# Patient Record
Sex: Male | Born: 1980 | Race: Black or African American | Hispanic: No | Marital: Married
Health system: Midwestern US, Community
[De-identification: ages and names within clinical notes are randomized; demographics above are authoritative.]

## PROBLEM LIST (undated history)

## (undated) DIAGNOSIS — N289 Disorder of kidney and ureter, unspecified: Secondary | ICD-10-CM

## (undated) DIAGNOSIS — K219 Gastro-esophageal reflux disease without esophagitis: Secondary | ICD-10-CM

## (undated) DIAGNOSIS — J45909 Unspecified asthma, uncomplicated: Secondary | ICD-10-CM

## (undated) DIAGNOSIS — G4730 Sleep apnea, unspecified: Secondary | ICD-10-CM

## (undated) HISTORY — PX: WISDOM TOOTH EXTRACTION: SHX21

## (undated) HISTORY — PX: FRACTURE SURGERY: SHX138

---

## 2016-12-31 ENCOUNTER — Emergency Department (HOSPITAL_COMMUNITY): Payer: Self-pay

## 2016-12-31 ENCOUNTER — Inpatient Hospital Stay (HOSPITAL_COMMUNITY): Payer: Self-pay | Admitting: Certified Registered"

## 2016-12-31 ENCOUNTER — Inpatient Hospital Stay (HOSPITAL_COMMUNITY): Payer: Self-pay

## 2016-12-31 ENCOUNTER — Encounter (HOSPITAL_COMMUNITY): Payer: Self-pay | Admitting: Emergency Medicine

## 2016-12-31 ENCOUNTER — Encounter (HOSPITAL_COMMUNITY): Admission: EM | Disposition: A | Discharge status: Home / Self Care | Payer: Self-pay | Source: Home / Self Care

## 2016-12-31 ENCOUNTER — Inpatient Hospital Stay (HOSPITAL_COMMUNITY)
Admission: EM | Admit: 2016-12-31 | Discharge: 2017-01-04 | DRG: 493 | Disposition: A | Discharge status: Home / Self Care | Payer: PRIVATE HEALTH INSURANCE | Attending: Orthopedic Surgery | Admitting: Orthopedic Surgery

## 2016-12-31 DIAGNOSIS — R778 Other specified abnormalities of plasma proteins: Secondary | ICD-10-CM

## 2016-12-31 DIAGNOSIS — Z886 Allergy status to analgesic agent status: Secondary | ICD-10-CM

## 2016-12-31 DIAGNOSIS — S81012A Laceration without foreign body, left knee, initial encounter: Secondary | ICD-10-CM | POA: Diagnosis present

## 2016-12-31 DIAGNOSIS — S82872B Displaced pilon fracture of left tibia, initial encounter for open fracture type I or II: Secondary | ICD-10-CM

## 2016-12-31 DIAGNOSIS — S060X9A Concussion with loss of consciousness of unspecified duration, initial encounter: Secondary | ICD-10-CM | POA: Diagnosis present

## 2016-12-31 DIAGNOSIS — R030 Elevated blood-pressure reading, without diagnosis of hypertension: Secondary | ICD-10-CM | POA: Diagnosis present

## 2016-12-31 DIAGNOSIS — R748 Abnormal levels of other serum enzymes: Secondary | ICD-10-CM | POA: Diagnosis present

## 2016-12-31 DIAGNOSIS — F17210 Nicotine dependence, cigarettes, uncomplicated: Secondary | ICD-10-CM | POA: Diagnosis present

## 2016-12-31 DIAGNOSIS — Z23 Encounter for immunization: Secondary | ICD-10-CM

## 2016-12-31 DIAGNOSIS — S2691XA Contusion of heart, unspecified with or without hemopericardium, initial encounter: Secondary | ICD-10-CM | POA: Diagnosis present

## 2016-12-31 DIAGNOSIS — E872 Acidosis: Secondary | ICD-10-CM | POA: Diagnosis present

## 2016-12-31 DIAGNOSIS — S82892C Other fracture of left lower leg, initial encounter for open fracture type IIIA, IIIB, or IIIC: Secondary | ICD-10-CM | POA: Diagnosis present

## 2016-12-31 DIAGNOSIS — T148XXA Other injury of unspecified body region, initial encounter: Secondary | ICD-10-CM

## 2016-12-31 DIAGNOSIS — N179 Acute kidney failure, unspecified: Secondary | ICD-10-CM | POA: Diagnosis present

## 2016-12-31 DIAGNOSIS — Z6837 Body mass index (BMI) 37.0-37.9, adult: Secondary | ICD-10-CM

## 2016-12-31 DIAGNOSIS — K219 Gastro-esophageal reflux disease without esophagitis: Secondary | ICD-10-CM | POA: Diagnosis present

## 2016-12-31 DIAGNOSIS — E669 Obesity, unspecified: Secondary | ICD-10-CM | POA: Diagnosis present

## 2016-12-31 DIAGNOSIS — G4733 Obstructive sleep apnea (adult) (pediatric): Secondary | ICD-10-CM | POA: Diagnosis present

## 2016-12-31 DIAGNOSIS — S82852C Displaced trimalleolar fracture of left lower leg, initial encounter for open fracture type IIIA, IIIB, or IIIC: Principal | ICD-10-CM | POA: Diagnosis present

## 2016-12-31 DIAGNOSIS — R7303 Prediabetes: Secondary | ICD-10-CM | POA: Diagnosis present

## 2016-12-31 DIAGNOSIS — R7989 Other specified abnormal findings of blood chemistry: Secondary | ICD-10-CM

## 2016-12-31 DIAGNOSIS — Y92410 Unspecified street and highway as the place of occurrence of the external cause: Secondary | ICD-10-CM

## 2016-12-31 DIAGNOSIS — T07XXXA Unspecified multiple injuries, initial encounter: Secondary | ICD-10-CM

## 2016-12-31 DIAGNOSIS — S20219A Contusion of unspecified front wall of thorax, initial encounter: Secondary | ICD-10-CM | POA: Diagnosis present

## 2016-12-31 DIAGNOSIS — N289 Disorder of kidney and ureter, unspecified: Secondary | ICD-10-CM

## 2016-12-31 DIAGNOSIS — S82892E Other fracture of left lower leg, subsequent encounter for open fracture type I or II with routine healing: Secondary | ICD-10-CM

## 2016-12-31 DIAGNOSIS — R55 Syncope and collapse: Secondary | ICD-10-CM | POA: Diagnosis present

## 2016-12-31 DIAGNOSIS — R739 Hyperglycemia, unspecified: Secondary | ICD-10-CM | POA: Diagnosis present

## 2016-12-31 HISTORY — DX: Gastro-esophageal reflux disease without esophagitis: K21.9

## 2016-12-31 HISTORY — PX: EXTERNAL FIXATION ANKLE FRACTURE: SHX1548

## 2016-12-31 HISTORY — PX: EXTERNAL FIXATION LEG: SHX1549

## 2016-12-31 HISTORY — DX: Disorder of kidney and ureter, unspecified: N28.9

## 2016-12-31 HISTORY — DX: Unspecified asthma, uncomplicated: J45.909

## 2016-12-31 HISTORY — DX: Sleep apnea, unspecified: G47.30

## 2016-12-31 LAB — I-STAT CHEM 8, ED
BUN: 18 mg/dL (ref 6–20)
CHLORIDE: 104 mmol/L (ref 101–111)
Calcium, Ion: 1.08 mmol/L — ABNORMAL LOW (ref 1.15–1.40)
Creatinine, Ser: 1.5 mg/dL — ABNORMAL HIGH (ref 0.61–1.24)
Glucose, Bld: 123 mg/dL — ABNORMAL HIGH (ref 65–99)
HEMATOCRIT: 48 % (ref 39.0–52.0)
Hemoglobin: 16.3 g/dL (ref 13.0–17.0)
POTASSIUM: 3.9 mmol/L (ref 3.5–5.1)
SODIUM: 143 mmol/L (ref 135–145)
TCO2: 24 mmol/L (ref 0–100)

## 2016-12-31 LAB — SAMPLE TO BLOOD BANK

## 2016-12-31 LAB — I-STAT CG4 LACTIC ACID, ED: LACTIC ACID, VENOUS: 6.01 mmol/L — AB (ref 0.5–1.9)

## 2016-12-31 LAB — CDS SEROLOGY

## 2016-12-31 LAB — COMPREHENSIVE METABOLIC PANEL
ALBUMIN: 4.4 g/dL (ref 3.5–5.0)
ALK PHOS: 65 U/L (ref 38–126)
ALT: 27 U/L (ref 17–63)
AST: 35 U/L (ref 15–41)
Anion gap: 15 (ref 5–15)
BILIRUBIN TOTAL: 0.6 mg/dL (ref 0.3–1.2)
BUN: 13 mg/dL (ref 6–20)
CALCIUM: 9.3 mg/dL (ref 8.9–10.3)
CO2: 20 mmol/L — AB (ref 22–32)
Chloride: 106 mmol/L (ref 101–111)
Creatinine, Ser: 1.55 mg/dL — ABNORMAL HIGH (ref 0.61–1.24)
GFR calc Af Amer: >60 mL/min (ref >60)
GFR calc non Af Amer: 56 mL/min — ABNORMAL LOW (ref >60)
GLUCOSE: 120 mg/dL — AB (ref 65–99)
Potassium: 3.6 mmol/L (ref 3.5–5.1)
SODIUM: 141 mmol/L (ref 135–145)
TOTAL PROTEIN: 7.1 g/dL (ref 6.5–8.1)

## 2016-12-31 LAB — PROTIME-INR
INR: 1
Prothrombin Time: 13.2 seconds (ref 11.4–15.2)

## 2016-12-31 LAB — CBC
HEMATOCRIT: 47.4 % (ref 39.0–52.0)
HEMOGLOBIN: 16.3 g/dL (ref 13.0–17.0)
MCH: 29.5 pg (ref 26.0–34.0)
MCHC: 34.4 g/dL (ref 30.0–36.0)
MCV: 85.7 fL (ref 78.0–100.0)
Platelets: 305 10*3/uL (ref 150–400)
RBC: 5.53 MIL/uL (ref 4.22–5.81)
RDW: 13.3 % (ref 11.5–15.5)
WBC: 9.1 10*3/uL (ref 4.0–10.5)

## 2016-12-31 LAB — I-STAT TROPONIN, ED: TROPONIN I, POC: 0 ng/mL (ref 0.00–0.08)

## 2016-12-31 LAB — ETHANOL

## 2016-12-31 SURGERY — EXTERNAL FIXATION, LOWER EXTREMITY
Anesthesia: General | Site: Leg Lower | Laterality: Left

## 2016-12-31 MED ORDER — LACTATED RINGERS IV SOLN
INTRAVENOUS | Status: DC
  Administered 2016-12-31 (×3): via INTRAVENOUS

## 2016-12-31 MED ORDER — MIDAZOLAM HCL 2 MG/2ML IJ SOLN
INTRAMUSCULAR | Status: AC
  Filled 2016-12-31: qty 2

## 2016-12-31 MED ORDER — SODIUM CHLORIDE 0.9 % IR SOLN
Status: DC | PRN
  Administered 2016-12-31 (×3): 3000 mL

## 2016-12-31 MED ORDER — HYDROMORPHONE HCL 1 MG/ML IJ SOLN
1.0000 mg | Freq: Once | INTRAMUSCULAR | Status: DC
Start: 2016-12-31 — End: 2016-12-31
  Filled 2016-12-31: qty 1

## 2016-12-31 MED ORDER — ONDANSETRON HCL 4 MG/2ML IJ SOLN
INTRAMUSCULAR | Status: AC
  Filled 2016-12-31: qty 2

## 2016-12-31 MED ORDER — ACETAMINOPHEN 325 MG PO TABS: 650.0000 mg | ORAL_TABLET | Freq: Four times a day (QID) | ORAL | Status: DC | PRN

## 2016-12-31 MED ORDER — FENTANYL CITRATE (PF) 250 MCG/5ML IJ SOLN
INTRAMUSCULAR | Status: AC
  Filled 2016-12-31: qty 5

## 2016-12-31 MED ORDER — PHENYLEPHRINE 40 MCG/ML (10ML) SYRINGE FOR IV PUSH (FOR BLOOD PRESSURE SUPPORT)
PREFILLED_SYRINGE | INTRAVENOUS | Status: AC
  Filled 2016-12-31: qty 10

## 2016-12-31 MED ORDER — CHLORHEXIDINE GLUCONATE 4 % EX LIQD: 60.0000 mL | Freq: Once | CUTANEOUS | Status: DC

## 2016-12-31 MED ORDER — POVIDONE-IODINE 10 % EX SWAB: 2.0000 "application " | Freq: Once | CUTANEOUS | Status: DC

## 2016-12-31 MED ORDER — ONDANSETRON HCL 4 MG/2ML IJ SOLN: 4.0000 mg | Freq: Four times a day (QID) | INTRAMUSCULAR | Status: DC | PRN

## 2016-12-31 MED ORDER — PANTOPRAZOLE SODIUM 40 MG IV SOLR
40.0000 mg | Freq: Every day | INTRAVENOUS | Status: DC
Start: 2016-12-31 — End: 2017-01-01

## 2016-12-31 MED ORDER — PANTOPRAZOLE SODIUM 40 MG PO TBEC
40.0000 mg | DELAYED_RELEASE_TABLET | Freq: Every day | ORAL | Status: DC
  Administered 2016-12-31 – 2017-01-04 (×5): 40 mg via ORAL
  Filled 2016-12-31 (×5): qty 1

## 2016-12-31 MED ORDER — CEFAZOLIN IN D5W 1 GM/50ML IV SOLN
1.0000 g | Freq: Three times a day (TID) | INTRAVENOUS | Status: DC
  Administered 2016-12-31 – 2017-01-04 (×12): 1 g via INTRAVENOUS
  Filled 2016-12-31 (×15): qty 50

## 2016-12-31 MED ORDER — SODIUM CHLORIDE 0.9 % IV BOLUS (SEPSIS)
1000.0000 mL | Freq: Once | INTRAVENOUS | Status: AC
  Administered 2016-12-31: 1000 mL via INTRAVENOUS

## 2016-12-31 MED ORDER — ONDANSETRON HCL 4 MG/2ML IJ SOLN
INTRAMUSCULAR | Status: DC | PRN
  Administered 2016-12-31: 4 mg via INTRAVENOUS

## 2016-12-31 MED ORDER — TETANUS-DIPHTH-ACELL PERTUSSIS 5-2.5-18.5 LF-MCG/0.5 IM SUSP
0.5000 mL | Freq: Once | INTRAMUSCULAR | Status: AC
  Administered 2016-12-31: 0.5 mL via INTRAMUSCULAR
  Filled 2016-12-31: qty 0.5

## 2016-12-31 MED ORDER — ONDANSETRON HCL 4 MG PO TABS: 4.0000 mg | ORAL_TABLET | Freq: Four times a day (QID) | ORAL | Status: DC | PRN

## 2016-12-31 MED ORDER — IOPAMIDOL (ISOVUE-300) INJECTION 61%
INTRAVENOUS | Status: AC
  Administered 2016-12-31: 100 mL
  Filled 2016-12-31: qty 100

## 2016-12-31 MED ORDER — CEFAZOLIN SODIUM 1 G IJ SOLR
INTRAMUSCULAR | Status: AC
  Filled 2016-12-31: qty 20

## 2016-12-31 MED ORDER — SUGAMMADEX SODIUM 500 MG/5ML IV SOLN
INTRAVENOUS | Status: DC | PRN
  Administered 2016-12-31: 220 mg via INTRAVENOUS

## 2016-12-31 MED ORDER — PHENYLEPHRINE HCL 10 MG/ML IJ SOLN
INTRAMUSCULAR | Status: DC | PRN
  Administered 2016-12-31 (×2): 120 ug via INTRAVENOUS

## 2016-12-31 MED ORDER — OXYCODONE HCL 5 MG PO TABS
5.0000 mg | ORAL_TABLET | ORAL | Status: DC | PRN
  Administered 2017-01-04: 5 mg via ORAL
  Filled 2016-12-31: qty 1

## 2016-12-31 MED ORDER — CEFAZOLIN SODIUM-DEXTROSE 2-4 GM/100ML-% IV SOLN: 2.0000 g | Freq: Once | INTRAVENOUS | Status: DC

## 2016-12-31 MED ORDER — CEFAZOLIN SODIUM-DEXTROSE 2-4 GM/100ML-% IV SOLN: 2.0000 g | Freq: Three times a day (TID) | INTRAVENOUS | Status: DC

## 2016-12-31 MED ORDER — MIDAZOLAM HCL 5 MG/5ML IJ SOLN
INTRAMUSCULAR | Status: DC | PRN
  Administered 2016-12-31: 2 mg via INTRAVENOUS

## 2016-12-31 MED ORDER — DEXAMETHASONE SODIUM PHOSPHATE 10 MG/ML IJ SOLN
INTRAMUSCULAR | Status: AC
  Filled 2016-12-31: qty 1

## 2016-12-31 MED ORDER — PROPOFOL 10 MG/ML IV BOLUS
INTRAVENOUS | Status: DC | PRN
Start: 2016-12-31 — End: 2016-12-31
  Administered 2016-12-31: 200 mg via INTRAVENOUS

## 2016-12-31 MED ORDER — CEFAZOLIN IN D5W 1 GM/50ML IV SOLN: 1.0000 g | Freq: Three times a day (TID) | INTRAVENOUS | Status: DC

## 2016-12-31 MED ORDER — LIDOCAINE 2% (20 MG/ML) 5 ML SYRINGE
INTRAMUSCULAR | Status: AC
  Filled 2016-12-31: qty 5

## 2016-12-31 MED ORDER — SUCCINYLCHOLINE CHLORIDE 20 MG/ML IJ SOLN
INTRAMUSCULAR | Status: DC | PRN
  Administered 2016-12-31: 120 mg via INTRAVENOUS

## 2016-12-31 MED ORDER — METOCLOPRAMIDE HCL 5 MG PO TABS: 5.0000 mg | ORAL_TABLET | Freq: Three times a day (TID) | ORAL | Status: DC | PRN

## 2016-12-31 MED ORDER — CEFAZOLIN SODIUM-DEXTROSE 2-4 GM/100ML-% IV SOLN: 2.0000 g | Freq: Four times a day (QID) | INTRAVENOUS | Status: DC

## 2016-12-31 MED ORDER — HYDROMORPHONE HCL 1 MG/ML IJ SOLN
1.0000 mg | INTRAMUSCULAR | Status: DC | PRN
  Administered 2017-01-01 – 2017-01-02 (×10): 1 mg via INTRAVENOUS
  Filled 2016-12-31 (×10): qty 1

## 2016-12-31 MED ORDER — OXYCODONE HCL 5 MG PO TABS
10.0000 mg | ORAL_TABLET | ORAL | Status: DC | PRN
  Administered 2016-12-31 – 2017-01-04 (×14): 10 mg via ORAL
  Filled 2016-12-31 (×14): qty 2

## 2016-12-31 MED ORDER — LIDOCAINE HCL (CARDIAC) 20 MG/ML IV SOLN
INTRAVENOUS | Status: DC | PRN
  Administered 2016-12-31: 100 mg via INTRATRACHEAL

## 2016-12-31 MED ORDER — HYDROMORPHONE HCL 1 MG/ML IJ SOLN
1.0000 mg | Freq: Once | INTRAMUSCULAR | Status: AC
  Administered 2016-12-31: 1 mg via INTRAVENOUS
  Filled 2016-12-31: qty 1

## 2016-12-31 MED ORDER — POTASSIUM CHLORIDE IN NACL 20-0.9 MEQ/L-% IV SOLN
INTRAVENOUS | Status: DC
  Administered 2016-12-31: 22:00:00 via INTRAVENOUS
  Administered 2017-01-01: 125 mL/h via INTRAVENOUS
  Administered 2017-01-01: 05:00:00 via INTRAVENOUS
  Filled 2016-12-31 (×4): qty 1000

## 2016-12-31 MED ORDER — METOCLOPRAMIDE HCL 5 MG/ML IJ SOLN: 5.0000 mg | Freq: Three times a day (TID) | INTRAMUSCULAR | Status: DC | PRN

## 2016-12-31 MED ORDER — FENTANYL CITRATE (PF) 100 MCG/2ML IJ SOLN
25.0000 ug | INTRAMUSCULAR | Status: DC | PRN
  Administered 2016-12-31: 50 ug via INTRAVENOUS

## 2016-12-31 MED ORDER — DEXAMETHASONE SODIUM PHOSPHATE 10 MG/ML IJ SOLN
INTRAMUSCULAR | Status: DC | PRN
  Administered 2016-12-31: 10 mg via INTRAVENOUS

## 2016-12-31 MED ORDER — ACETAMINOPHEN 325 MG PO TABS
650.0000 mg | ORAL_TABLET | ORAL | Status: DC | PRN
  Administered 2017-01-01 – 2017-01-04 (×11): 650 mg via ORAL
  Filled 2016-12-31 (×11): qty 2

## 2016-12-31 MED ORDER — CEFAZOLIN IN D5W 1 GM/50ML IV SOLN
1.0000 g | Freq: Once | INTRAVENOUS | Status: DC
  Filled 2016-12-31: qty 50

## 2016-12-31 MED ORDER — PROMETHAZINE HCL 25 MG/ML IJ SOLN: 6.2500 mg | INTRAMUSCULAR | Status: DC | PRN

## 2016-12-31 MED ORDER — ENOXAPARIN SODIUM 40 MG/0.4ML ~~LOC~~ SOLN
40.0000 mg | SUBCUTANEOUS | Status: DC
  Administered 2017-01-01 – 2017-01-04 (×4): 40 mg via SUBCUTANEOUS
  Filled 2016-12-31 (×4): qty 0.4

## 2016-12-31 MED ORDER — ALBUTEROL SULFATE HFA 108 (90 BASE) MCG/ACT IN AERS
INHALATION_SPRAY | RESPIRATORY_TRACT | Status: AC
  Filled 2016-12-31: qty 6.7

## 2016-12-31 MED ORDER — FENTANYL CITRATE (PF) 250 MCG/5ML IJ SOLN
INTRAMUSCULAR | Status: DC | PRN
  Administered 2016-12-31 (×2): 100 ug via INTRAVENOUS
  Administered 2016-12-31 (×2): 50 ug via INTRAVENOUS
  Administered 2016-12-31: 100 ug via INTRAVENOUS

## 2016-12-31 MED ORDER — ALBUTEROL SULFATE HFA 108 (90 BASE) MCG/ACT IN AERS
INHALATION_SPRAY | RESPIRATORY_TRACT | Status: DC | PRN
  Administered 2016-12-31: 4 via RESPIRATORY_TRACT
  Administered 2016-12-31 (×4): 2 via RESPIRATORY_TRACT

## 2016-12-31 MED ORDER — CEFAZOLIN SODIUM 1 G IJ SOLR
INTRAMUSCULAR | Status: DC | PRN
  Administered 2016-12-31: 2 g via INTRAMUSCULAR

## 2016-12-31 MED ORDER — SUGAMMADEX SODIUM 200 MG/2ML IV SOLN
INTRAVENOUS | Status: AC
  Filled 2016-12-31: qty 2

## 2016-12-31 MED ORDER — ACETAMINOPHEN 650 MG RE SUPP: 650.0000 mg | Freq: Four times a day (QID) | RECTAL | Status: DC | PRN

## 2016-12-31 MED ORDER — HYDROMORPHONE HCL 1 MG/ML IJ SOLN
1.0000 mg | Freq: Once | INTRAMUSCULAR | Status: AC
  Administered 2016-12-31: 1 mg via INTRAVENOUS

## 2016-12-31 MED ORDER — ROCURONIUM 10MG/ML (10ML) SYRINGE FOR MEDFUSION PUMP - OPTIME
INTRAVENOUS | Status: DC | PRN
  Administered 2016-12-31: 50 mg via INTRAVENOUS

## 2016-12-31 MED ORDER — FENTANYL CITRATE (PF) 100 MCG/2ML IJ SOLN
INTRAMUSCULAR | Status: AC
  Filled 2016-12-31: qty 2

## 2016-12-31 MED ORDER — ARTIFICIAL TEARS OP OINT
TOPICAL_OINTMENT | OPHTHALMIC | Status: AC
  Filled 2016-12-31: qty 3.5

## 2016-12-31 SURGICAL SUPPLY — 44 items
BAR GLASS FIBER EXFX 11X400 (EXFIX) ×6 IMPLANT
BIT DRILL CANN MED FLUTE 4.0 (BIT) ×1 IMPLANT
BNDG ELASTIC 2 VLCR STRL LF (GAUZE/BANDAGES/DRESSINGS) ×3 IMPLANT
BNDG GAUZE ELAST 4 BULKY (GAUZE/BANDAGES/DRESSINGS) ×6 IMPLANT
CLAMP BLUE BAR TO PIN (EXFIX) ×6 IMPLANT
COVER SURGICAL LIGHT HANDLE (MISCELLANEOUS) ×3 IMPLANT
DRAPE C-ARM 42X72 X-RAY (DRAPES) IMPLANT
DRAPE C-ARMOR (DRAPES) ×3 IMPLANT
DRAPE U-SHAPE 47X51 STRL (DRAPES) ×3 IMPLANT
DRILL CANN 4.0MM (BIT) ×3
ELECT REM PT RETURN 9FT ADLT (ELECTROSURGICAL) ×3
ELECTRODE REM PT RTRN 9FT ADLT (ELECTROSURGICAL) ×1 IMPLANT
GAUZE SPONGE 4X4 12PLY STRL LF (GAUZE/BANDAGES/DRESSINGS) ×3 IMPLANT
GAUZE XEROFORM 5X9 LF (GAUZE/BANDAGES/DRESSINGS) ×3 IMPLANT
GLOVE BIO SURGEON STRL SZ7.5 (GLOVE) ×3 IMPLANT
GLOVE BIOGEL PI IND STRL 8 (GLOVE) ×1 IMPLANT
GLOVE BIOGEL PI INDICATOR 8 (GLOVE) ×2
GOWN STRL REUS W/ TWL LRG LVL3 (GOWN DISPOSABLE) ×2 IMPLANT
GOWN STRL REUS W/ TWL XL LVL3 (GOWN DISPOSABLE) ×1 IMPLANT
GOWN STRL REUS W/TWL LRG LVL3 (GOWN DISPOSABLE) ×4
GOWN STRL REUS W/TWL XL LVL3 (GOWN DISPOSABLE) ×2
HANDPIECE INTERPULSE COAX TIP (DISPOSABLE)
KIT BASIN OR (CUSTOM PROCEDURE TRAY) ×3 IMPLANT
KIT ROOM TURNOVER OR (KITS) ×3 IMPLANT
NEEDLE 22X1 1/2 (OR ONLY) (NEEDLE) IMPLANT
NS IRRIG 1000ML POUR BTL (IV SOLUTION) ×3 IMPLANT
PACK ORTHO EXTREMITY (CUSTOM PROCEDURE TRAY) ×3 IMPLANT
PAD ABD 8X10 STRL (GAUZE/BANDAGES/DRESSINGS) ×3 IMPLANT
PAD ARMBOARD 7.5X6 YLW CONV (MISCELLANEOUS) ×6 IMPLANT
PIN CLAMP 2BAR 75MM BLUE (EXFIX) ×3 IMPLANT
PIN HALF YELLOW 5X160X35 (EXFIX) ×9 IMPLANT
PIN TRANSFIXING 5.0 (EXFIX) ×3 IMPLANT
SET HNDPC FAN SPRY TIP SCT (DISPOSABLE) IMPLANT
SPONGE LAP 18X18 X RAY DECT (DISPOSABLE) ×3 IMPLANT
SPONGE SCRUB IODOPHOR (GAUZE/BANDAGES/DRESSINGS) ×3 IMPLANT
STOCKINETTE IMPERVIOUS LG (DRAPES) ×3 IMPLANT
SUT ETHILON 2 0 PSLX (SUTURE) ×9 IMPLANT
SYR CONTROL 10ML LL (SYRINGE) IMPLANT
TOWEL OR 17X24 6PK STRL BLUE (TOWEL DISPOSABLE) ×3 IMPLANT
TOWEL OR 17X26 10 PK STRL BLUE (TOWEL DISPOSABLE) ×3 IMPLANT
TUBE CONNECTING 12'X1/4 (SUCTIONS) ×1
TUBE CONNECTING 12X1/4 (SUCTIONS) ×2 IMPLANT
UNDERPAD 30X30 (UNDERPADS AND DIAPERS) ×3 IMPLANT
YANKAUER SUCT BULB TIP NO VENT (SUCTIONS) ×3 IMPLANT

## 2016-12-31 NOTE — ED Notes (Signed)
O2 sat 91% on room air, Patient placed on 2L O2 via [place], O2 sat improved to 98%

## 2016-12-31 NOTE — Progress Notes (Signed)
Orthopedic Tech Progress Note Patient Details:  Terry Payne 10/18/1980 [id number]  Ortho Devices Type of Ortho Device: Ace wrap, Short leg splint, Stirrup splint Ortho Device/Splint Location: Level 2 Trauma Ortho Device/Splint Interventions: Application   Saul Fordyce 12/31/2016, 1:57 PM

## 2016-12-31 NOTE — Brief Op Note (Signed)
12/31/2016  7:47 PM  PATIENT:  Terry Payne  36 y.o. male  PRE-OPERATIVE DIAGNOSIS:  OPEN LEFT ANKLE FRACTURE  POST-OPERATIVE DIAGNOSIS:  OPEN LEFT ANKLE FRACTURE  PROCEDURE:  Procedure(s): I&D/EX-FIX OPEN LEFT ANKLE (Left)  SURGEON:  Surgeon(s) and Role:    * Yolonda Kida, MD - Primary  PHYSICIAN ASSISTANT:   ASSISTANTS: none   ANESTHESIA:   general  EBL:  No intake/output data recorded.  BLOOD ADMINISTERED:none  DRAINS: none   LOCAL MEDICATIONS USED:  NONE  SPECIMEN:  No Specimen  DISPOSITION OF SPECIMEN:  N/A  COUNTS:  YES  TOURNIQUET:  * No tourniquets in log *  DICTATION: .Note written in EPIC  PLAN OF CARE: Admit to inpatient   PATIENT DISPOSITION:  PACU - hemodynamically stable.   Delay start of Pharmacological VTE agent (>24hrs) due to surgical blood loss or risk of bleeding: not applicable

## 2016-12-31 NOTE — ED Notes (Signed)
Report called to OR  
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
## 2016-12-31 NOTE — ED Notes (Signed)
Warm blankets placed on patient 

## 2016-12-31 NOTE — Anesthesia Preprocedure Evaluation (Signed)
Anesthesia Evaluation  Patient identified by MRN, date of birth, ID band Patient awake    Reviewed: Allergy & Precautions, NPO status , Patient's Chart, lab work & pertinent test results  Airway Mallampati: III  TM Distance: >3 FB Neck ROM: Full    Dental  (+) Teeth Intact, Dental Advisory Given   Pulmonary sleep apnea , Current Smoker,    Pulmonary exam normal breath sounds clear to auscultation       Cardiovascular Exercise Tolerance: Good negative cardio ROS Normal cardiovascular exam Rhythm:Regular Rate:Normal     Neuro/Psych negative neurological ROS  negative psych ROS   GI/Hepatic negative GI ROS, Neg liver ROS,   Endo/Other  negative endocrine ROS  Renal/GU Renal InsufficiencyRenal disease (AKI)     Musculoskeletal negative musculoskeletal ROS (+)   Abdominal   Peds  Hematology negative hematology ROS (+)   Anesthesia Other Findings Day of surgery medications reviewed with the patient.  Reproductive/Obstetrics                             Anesthesia Physical Anesthesia Plan  ASA: II and emergent  Anesthesia Plan: General   Post-op Pain Management:    Induction: Intravenous, Rapid sequence and Cricoid pressure planned  Airway Management Planned: Oral ETT and Video Laryngoscope Planned  Additional Equipment:   Intra-op Plan:   Post-operative Plan: Extubation in OR  Informed Consent: I have reviewed the patients History and Physical, chart, labs and discussed the procedure including the risks, benefits and alternatives for the proposed anesthesia with the patient or authorized representative who has indicated his/her understanding and acceptance.   Dental advisory given  Plan Discussed with: CRNA  Anesthesia Plan Comments: (Risks/benefits of general anesthesia discussed with patient including risk of damage to teeth, lips, gum, and tongue, nausea/vomiting, allergic  reactions to medications, and the possibility of heart attack, stroke and death.  All patient questions answered.  Patient wishes to proceed.)        Anesthesia Quick Evaluation

## 2016-12-31 NOTE — ED Notes (Signed)
Patient transported to CT 

## 2016-12-31 NOTE — Anesthesia Postprocedure Evaluation (Addendum)
Anesthesia Post Note  Patient: Terry Payne  Procedure(s) Performed: Procedure(s) (LRB): I&D/EX-FIX OPEN LEFT ANKLE (Left)  Patient location during evaluation: PACU Anesthesia Type: General Level of consciousness: awake and alert Pain management: pain level controlled Vital Signs Assessment: post-procedure vital signs reviewed and stable Respiratory status: spontaneous breathing, nonlabored ventilation, respiratory function stable and patient connected to nasal cannula oxygen Cardiovascular status: blood pressure returned to baseline and stable Postop Assessment: no signs of nausea or vomiting Anesthetic complications: no       Last Vitals:  Vitals:   12/31/16 2030 12/31/16 2046  BP: (!) 156/104 (!) 147/93  Pulse: 92 87  Resp: (!) 8 13  Temp:  37 C    Last Pain:  Vitals:   12/31/16 2116  TempSrc:   PainSc: 9                  Willy Vorce
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
## 2016-12-31 NOTE — Anesthesia Procedure Notes (Signed)
Procedure Name: Intubation Date/Time: 12/31/2016 5:45 PM Performed by: Rosiland Oz Pre-anesthesia Checklist: Patient identified, Emergency Drugs available, Suction available, Patient being monitored and Timeout performed Patient Re-evaluated:Patient Re-evaluated prior to inductionOxygen Delivery Method: Circle system utilized Preoxygenation: Pre-oxygenation with 100% oxygen Intubation Type: IV induction, Rapid sequence and Cricoid Pressure applied Laryngoscope Size: Glidescope and 4 Grade View: Grade I Tube type: Oral Tube size: 7.5 mm Number of attempts: 1 Airway Equipment and Method: Stylet Placement Confirmation: ETT inserted through vocal cords under direct vision,  positive ETCO2 and breath sounds checked- equal and bilateral Secured at: 23 cm Tube secured with: Tape Dental Injury: Teeth and Oropharynx as per pre-operative assessment

## 2016-12-31 NOTE — Transfer of Care (Signed)
Immediate Anesthesia Transfer of Care Note  Patient: Terry Payne  Procedure(s) Performed: Procedure(s): I&D/EX-FIX OPEN LEFT ANKLE (Left)  Patient Location: PACU  Anesthesia Type:General  Level of Consciousness: awake, alert , oriented and patient cooperative  Airway & Oxygen Therapy: Patient Spontanous Breathing and Patient connected to face mask oxygen  Post-op Assessment: Report given to RN and Post -op Vital signs reviewed and stable  Post vital signs: Reviewed and stable  Last Vitals:  Vitals:   12/31/16 1630 12/31/16 1645  BP: 123/86 132/78  Pulse: 92 91  Resp: 13 13  Temp:      Last Pain:  Vitals:   12/31/16 1311  TempSrc: Oral  PainSc:          Complications: No apparent anesthesia complications

## 2016-12-31 NOTE — ED Notes (Signed)
Patient's boss/Herb [place] @ [phone].
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
## 2017-01-01 ENCOUNTER — Inpatient Hospital Stay (HOSPITAL_COMMUNITY): Payer: Self-pay

## 2017-01-01 ENCOUNTER — Encounter (HOSPITAL_COMMUNITY): Payer: Self-pay | Admitting: Orthopedic Surgery

## 2017-01-01 DIAGNOSIS — S82892C Other fracture of left lower leg, initial encounter for open fracture type IIIA, IIIB, or IIIC: Secondary | ICD-10-CM

## 2017-01-01 DIAGNOSIS — R7989 Other specified abnormal findings of blood chemistry: Secondary | ICD-10-CM

## 2017-01-01 DIAGNOSIS — R739 Hyperglycemia, unspecified: Secondary | ICD-10-CM | POA: Diagnosis present

## 2017-01-01 DIAGNOSIS — R778 Other specified abnormalities of plasma proteins: Secondary | ICD-10-CM

## 2017-01-01 DIAGNOSIS — R55 Syncope and collapse: Secondary | ICD-10-CM

## 2017-01-01 DIAGNOSIS — G4733 Obstructive sleep apnea (adult) (pediatric): Secondary | ICD-10-CM | POA: Diagnosis present

## 2017-01-01 DIAGNOSIS — R748 Abnormal levels of other serum enzymes: Secondary | ICD-10-CM

## 2017-01-01 LAB — BASIC METABOLIC PANEL
ANION GAP: 9 (ref 5–15)
BUN: 11 mg/dL (ref 6–20)
CHLORIDE: 104 mmol/L (ref 101–111)
CO2: 23 mmol/L (ref 22–32)
Calcium: 8.5 mg/dL — ABNORMAL LOW (ref 8.9–10.3)
Creatinine, Ser: 1.3 mg/dL — ABNORMAL HIGH (ref 0.61–1.24)
Glucose, Bld: 153 mg/dL — ABNORMAL HIGH (ref 65–99)
POTASSIUM: 4.5 mmol/L (ref 3.5–5.1)
SODIUM: 136 mmol/L (ref 135–145)

## 2017-01-01 LAB — HIV ANTIBODY (ROUTINE TESTING W REFLEX): HIV SCREEN 4TH GENERATION: NONREACTIVE

## 2017-01-01 LAB — CBC
HCT: 39.8 % (ref 39.0–52.0)
HEMOGLOBIN: 13 g/dL (ref 13.0–17.0)
MCH: 28 pg (ref 26.0–34.0)
MCHC: 32.7 g/dL (ref 30.0–36.0)
MCV: 85.6 fL (ref 78.0–100.0)
PLATELETS: 269 10*3/uL (ref 150–400)
RBC: 4.65 MIL/uL (ref 4.22–5.81)
RDW: 13.3 % (ref 11.5–15.5)
WBC: 14.9 10*3/uL — ABNORMAL HIGH (ref 4.0–10.5)

## 2017-01-01 LAB — URINALYSIS, ROUTINE W REFLEX MICROSCOPIC
BILIRUBIN URINE: NEGATIVE
Bacteria, UA: NONE SEEN
GLUCOSE, UA: NEGATIVE mg/dL
Ketones, ur: NEGATIVE mg/dL
LEUKOCYTES UA: NEGATIVE
NITRITE: NEGATIVE
PROTEIN: NEGATIVE mg/dL
Specific Gravity, Urine: 1.01 (ref 1.005–1.030)
Squamous Epithelial / LPF: NONE SEEN
pH: 5 (ref 5.0–8.0)

## 2017-01-01 LAB — TROPONIN I
TROPONIN I: 0.28 ng/mL — AB (ref <0.03)
Troponin I: 0.27 ng/mL (ref <0.03)

## 2017-01-01 LAB — LACTIC ACID, PLASMA: Lactic Acid, Venous: 3.2 mmol/L (ref 0.5–1.9)

## 2017-01-01 NOTE — Progress Notes (Signed)
RT NOTE:  Overnight pulse-ox setup @ bedside. Alarms verfied. Will return to remove @ 0630 on 01/02/17.
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
## 2017-01-01 NOTE — Progress Notes (Signed)
**  Preliminary report by tech**  Carotid artery duplex complete. Findings are consistent with a 1-39 percent stenosis involving the right internal carotid artery and the left internal carotid artery. The vertebral arteries demonstrate antegrade flow bilaterally. The left vertebral artery demonstrates rabbit sign waveforms of unknown etiology.  01/01/17 5:19 PM Olen Cordial RVT
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
## 2017-01-01 NOTE — Progress Notes (Signed)
OT Cancellation Note  Patient Details Name: Terry Payne MRN: [id number] DOB: 1981-01-15   Cancelled Treatment:    Reason Eval/Treat Not Completed: Other (comment) (pending ORIF - hold OT eval to more appropriate time)  Boone Master B 01/01/2017, 1:44 PM

## 2017-01-01 NOTE — Progress Notes (Signed)
EEG completed, results pending. 
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
## 2017-01-02 DIAGNOSIS — R7303 Prediabetes: Secondary | ICD-10-CM

## 2017-01-02 LAB — TROPONIN I: Troponin I: 0.22 ng/mL (ref <0.03)

## 2017-01-02 LAB — VAS US CAROTID
LCCADDIAS: -27 cm/s
LCCADSYS: -93 cm/s
LCCAPDIAS: 24 cm/s
LEFT ECA DIAS: -20 cm/s
LEFT VERTEBRAL DIAS: -21 cm/s
LICADDIAS: -33 cm/s
LICADSYS: -68 cm/s
LICAPDIAS: -30 cm/s
LICAPSYS: -81 cm/s
Left CCA prox sys: 139 cm/s
RCCAPDIAS: -15 cm/s
RCCAPSYS: -98 cm/s
RIGHT ECA DIAS: -24 cm/s
RIGHT VERTEBRAL DIAS: 18 cm/s
Right cca dist sys: -63 cm/s

## 2017-01-02 LAB — HEMOGLOBIN A1C
Hgb A1c MFr Bld: 5.9 % — ABNORMAL HIGH (ref 4.8–5.6)
MEAN PLASMA GLUCOSE: 123 mg/dL

## 2017-01-02 MED ORDER — SODIUM CHLORIDE 0.9 % IV BOLUS (SEPSIS)
1000.0000 mL | Freq: Once | INTRAVENOUS | Status: AC
  Administered 2017-01-02: 1000 mL via INTRAVENOUS

## 2017-01-02 NOTE — Progress Notes (Signed)
RT NOTE:  Overnight pulse-ox removed @ 0620. RT will download report in department and send report to dayshift RN for patients chart.
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
## 2017-01-03 ENCOUNTER — Inpatient Hospital Stay (HOSPITAL_COMMUNITY): Payer: Self-pay

## 2017-01-03 DIAGNOSIS — R55 Syncope and collapse: Secondary | ICD-10-CM

## 2017-01-03 DIAGNOSIS — S82892E Other fracture of left lower leg, subsequent encounter for open fracture type I or II with routine healing: Secondary | ICD-10-CM

## 2017-01-03 LAB — BASIC METABOLIC PANEL
Anion gap: 12 (ref 5–15)
BUN: 11 mg/dL (ref 6–20)
CALCIUM: 9.1 mg/dL (ref 8.9–10.3)
CO2: 26 mmol/L (ref 22–32)
CREATININE: 1.25 mg/dL — AB (ref 0.61–1.24)
Chloride: 98 mmol/L — ABNORMAL LOW (ref 101–111)
Glucose, Bld: 110 mg/dL — ABNORMAL HIGH (ref 65–99)
Potassium: 3.7 mmol/L (ref 3.5–5.1)
SODIUM: 136 mmol/L (ref 135–145)

## 2017-01-03 LAB — ECHOCARDIOGRAM COMPLETE
CHL CUP MV DEC (S): 190
E decel time: 190 msec
EERAT: 4.79
FS: 35 % (ref 28–44)
HEIGHTINCHES: 66 in
LA diam index: 1.42 cm/m2
LA vol index: 28.2 mL/m2
LASIZE: 32 mm
LAVOL: 63.4 mL
LAVOLA4C: 54.6 mL
LEFT ATRIUM END SYS DIAM: 32 mm
LVEEAVG: 4.79
LVEEMED: 4.79
LVELAT: 15.5 cm/s
MV Peak grad: 2 mmHg
MVAP: 3.93 cm2
MVPKAVEL: 83.9 m/s
MVPKEVEL: 74.2 m/s
MVSPHT: 56 ms
RV LATERAL S' VELOCITY: 19 cm/s
RV TAPSE: 20.9 mm
RV sys press: 35 mmHg
Reg peak vel: 282 cm/s
TDI e' lateral: 15.5
TDI e' medial: 11.7
TR max vel: 282 cm/s
WEIGHTICAEL: 3680 [oz_av]

## 2017-01-03 LAB — RAPID URINE DRUG SCREEN, HOSP PERFORMED
Amphetamines: NOT DETECTED
Barbiturates: NOT DETECTED
Benzodiazepines: NOT DETECTED
Cocaine: NOT DETECTED
OPIATES: POSITIVE — AB
Tetrahydrocannabinol: NOT DETECTED

## 2017-01-03 MED ORDER — MAGNESIUM CITRATE PO SOLN
1.0000 | Freq: Once | ORAL | Status: AC
  Administered 2017-01-03: 1 via ORAL
  Filled 2017-01-03: qty 296

## 2017-01-03 MED ORDER — POLYETHYLENE GLYCOL 3350 17 G PO PACK
17.0000 g | PACK | Freq: Every day | ORAL | Status: DC
  Administered 2017-01-04: 17 g via ORAL
  Filled 2017-01-03: qty 1
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
## 2017-01-03 NOTE — Discharge Instructions (Signed)
Nonweightbearing to left lower extremity. Elevate to keep swelling down. Daily pin site care. Take your Lovenox daily to prevent a blood clot. Follow-up with orthopedics next week to discuss definitive surgery on your left ankle. Your brain and heart studies were normal. Follow-up with your primary care physician regarding further syncopal workup if needed.
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
## 2017-01-03 NOTE — Progress Notes (Signed)
  Echocardiogram 2D Echocardiogram has been performed.  Everest Brod T Luann Aspinwall 01/03/2017, 12:54 PM
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
## 2017-01-03 NOTE — Progress Notes (Signed)
OT Cancellation Note  Patient Details Name: Amadi Frady MRN: [id number] DOB: [birth year]   Cancelled Treatment:    Reason Eval/Treat Not Completed: Patient at procedure or test/ unavailable (with transport)  Felecia Shelling   OTR/L Pager: [phone] Office: [phone] .  01/03/2017, 11:54 AM
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
## 2017-01-04 ENCOUNTER — Encounter (HOSPITAL_COMMUNITY): Payer: Self-pay | Admitting: General Surgery

## 2017-01-04 DIAGNOSIS — N289 Disorder of kidney and ureter, unspecified: Secondary | ICD-10-CM

## 2017-01-04 HISTORY — DX: Disorder of kidney and ureter, unspecified: N28.9

## 2017-01-04 MED ORDER — OXYCODONE HCL 10 MG PO TABS: 10.0000 mg | ORAL_TABLET | ORAL | 0 refills | Status: AC | PRN

## 2017-01-04 MED ORDER — ACETAMINOPHEN 325 MG PO TABS: 650.0000 mg | ORAL_TABLET | ORAL | Status: AC | PRN

## 2017-01-04 MED ORDER — ONDANSETRON HCL 4 MG PO TABS: 4.0000 mg | ORAL_TABLET | Freq: Four times a day (QID) | ORAL | 0 refills | Status: AC | PRN

## 2017-01-04 MED ORDER — ENOXAPARIN SODIUM 40 MG/0.4ML ~~LOC~~ SOLN: 40.0000 mg | SUBCUTANEOUS | 0 refills | Status: AC

## 2017-01-04 MED ORDER — CEPHALEXIN 250 MG PO CAPS: 250.0000 mg | ORAL_CAPSULE | Freq: Four times a day (QID) | ORAL | 0 refills | Status: AC

## 2017-01-04 MED ORDER — POLYETHYLENE GLYCOL 3350 17 G PO PACK: PACK | ORAL | 0 refills | Status: AC
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
## 2017-01-04 NOTE — Progress Notes (Signed)
Subjective: 4 Days Post-Op Procedure(s) (LRB): I&D/EX-FIX OPEN LEFT ANKLE (Left)  Patient reports pain as mild to moderate.  Tolerating POs well.  Admits to BM.  Denies fever, chills, N/V, CP, SOB.  Reports that he is planning on having further surgery for ankle once he gets home in [place].  Objective:   VITALS:  Temp:  [99.6 F (37.6 C)-100.7 F (38.2 C)] 100.1 F (37.8 C) (04/07 0437) Pulse Rate:  [104-106] 104 (04/07 0437) Resp:  [16-17] 17 (04/07 0437) BP: (114-146)/(63-98) 116/63 (04/07 0437) SpO2:  [95 %-99 %] 96 % (04/07 0437)  General: WDWN patient in NAD. Psych:  Appropriate mood and affect. Neuro:  A&O x 3, Moving all extremities, sensation intact to light touch HEENT:  EOMs intact Chest:  Even non-labored respirations Skin:  Dressing and Ex-fix D/I, dry red blood at plantar heel. no rashes or lesions Extremities: warm/dry, mild edema, no erythema or echymosis.  No lymphadenopathy. Pulses: Popliteus 2+ MSK:  ROM: EHL/FHL intact, MMT: patient can perform quad set    LABS No results for input(s): HGB, WBC, PLT in the last 72 hours.  Recent Labs  01/03/17 1550  NA 136  K 3.7  CL 98*  CO2 26  BUN 11  CREATININE 1.25*  GLUCOSE 110*   No results for input(s): LABPT, INR in the last 72 hours.   Assessment/Plan: 4 Days Post-Op Procedure(s) (LRB): I&D/EX-FIX OPEN LEFT ANKLE (Left)  Patient seen in rounds on behalf of Dr. Aundria Rud. NWB LLE Up with therapy May change outer kling dressing prn.  Do not remove ACE bandage or dressing underneath. Plan for further surgical correction at outside facility in [place].Marland Kitchen  Alfredo Martinez, PA-C [facility] Office:  [phone]

## 2017-03-03 NOTE — Addendum Note (Signed)
Addendum  created 03/03/17 1324 by Val EagleMoser, Murle Otting, MD   Sign clinical note
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
## 2020-03-15 ENCOUNTER — Inpatient Hospital Stay
Admit: 2020-03-15 | Discharge: 2020-03-16 | Disposition: A | Discharge status: Home / Self Care | Attending: Emergency Medicine

## 2020-03-15 ENCOUNTER — Emergency Department: Admit: 2020-03-16

## 2020-03-15 DIAGNOSIS — S93402A Sprain of unspecified ligament of left ankle, initial encounter: Secondary | ICD-10-CM

## 2020-03-15 NOTE — ED Notes (Signed)
Pt ambulatory with limp in hallway; no distress noted.
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
## 2020-03-16 MED ORDER — HYDROCODONE-ACETAMINOPHEN 7.5 MG-325 MG TAB
ORAL | Status: AC
Start: 2020-03-16 — End: 2020-03-15
  Administered 2020-03-16: 01:00:00 via ORAL

## 2020-03-16 MED ORDER — HYDROCODONE-ACETAMINOPHEN 5 MG-325 MG TAB
5-325 mg | ORAL_TABLET | Freq: Four times a day (QID) | ORAL | 0 refills | Status: AC | PRN
Start: 2020-03-16 — End: 2020-03-18

## 2020-03-16 MED ORDER — DIPHTH,PERTUS(AC)TETANUS VAC(PF) 2.5 LF UNIT-8 MCG-5 LF/0.5 ML INJ
INTRAMUSCULAR | Status: AC
Start: 2020-03-16 — End: 2020-03-15
  Administered 2020-03-16: 01:00:00 via INTRAMUSCULAR

## 2020-03-16 MED FILL — BOOSTRIX TDAP 2.5 LF UNIT-8 MCG-5 LF/0.5 ML INTRAMUSCULAR SYRINGE: INTRAMUSCULAR | Qty: 0.5

## 2020-03-16 MED FILL — BOOSTRIX TDAP 2.5 LF UNIT-8 MCG-5 LF/0.5 ML INTRAMUSCULAR SUSPENSION: INTRAMUSCULAR | Qty: 0.5

## 2020-03-16 MED FILL — HYDROCODONE-ACETAMINOPHEN 7.5 MG-325 MG TAB: ORAL | Qty: 1

## 2020-03-16 NOTE — ED Notes (Signed)
I have reviewed discharge instructions with the patient.  The patient verbalized understanding.    Medication teaching given, to include name, dose, action, and side effects. Patient verbalized understanding of medications. Encouraged patient to voice any concerns with reassurance provided.  Instructed not to drive or use heavy machinery while taking this medication. Patient states they have someone to drive them home.     Patient armband removed and shredded    Patient Discharged in stable condition.
